# Patient Record
Sex: Female | Born: 1995 | Race: White | Hispanic: No | Marital: Single | Smoking: Never smoker
Health system: Southern US, Community
[De-identification: ages and names within clinical notes are randomized; demographics above are authoritative.]

## PROBLEM LIST (undated history)

## (undated) DIAGNOSIS — K219 Gastro-esophageal reflux disease without esophagitis: Secondary | ICD-10-CM

## (undated) DIAGNOSIS — F419 Anxiety disorder, unspecified: Secondary | ICD-10-CM

## (undated) HISTORY — PX: WISDOM TOOTH EXTRACTION: SHX21

---

## 2016-06-04 ENCOUNTER — Encounter (INDEPENDENT_AMBULATORY_CARE_PROVIDER_SITE_OTHER): Payer: Self-pay | Admitting: Internal Medicine

## 2016-06-07 ENCOUNTER — Encounter (HOSPITAL_COMMUNITY): Payer: Self-pay

## 2016-06-07 ENCOUNTER — Emergency Department (HOSPITAL_COMMUNITY): Payer: 59

## 2016-06-07 ENCOUNTER — Emergency Department (HOSPITAL_COMMUNITY)
Admission: EM | Admit: 2016-06-07 | Discharge: 2016-06-07 | Disposition: A | Discharge status: Home / Self Care | Payer: 59 | Attending: Emergency Medicine | Admitting: Emergency Medicine

## 2016-06-07 DIAGNOSIS — R1011 Right upper quadrant pain: Secondary | ICD-10-CM | POA: Diagnosis present

## 2016-06-07 DIAGNOSIS — Z79899 Other long term (current) drug therapy: Secondary | ICD-10-CM | POA: Diagnosis not present

## 2016-06-07 DIAGNOSIS — K8070 Calculus of gallbladder and bile duct without cholecystitis without obstruction: Secondary | ICD-10-CM | POA: Diagnosis not present

## 2016-06-07 DIAGNOSIS — K8050 Calculus of bile duct without cholangitis or cholecystitis without obstruction: Secondary | ICD-10-CM

## 2016-06-07 DIAGNOSIS — K8020 Calculus of gallbladder without cholecystitis without obstruction: Secondary | ICD-10-CM

## 2016-06-07 HISTORY — DX: Gastro-esophageal reflux disease without esophagitis: K21.9

## 2016-06-07 LAB — COMPREHENSIVE METABOLIC PANEL
ALT: 19 U/L (ref 14–54)
AST: 29 U/L (ref 15–41)
Albumin: 4.3 g/dL (ref 3.5–5.0)
Alkaline Phosphatase: 72 U/L (ref 38–126)
Anion gap: 10 (ref 5–15)
BUN: 8 mg/dL (ref 6–20)
CHLORIDE: 104 mmol/L (ref 101–111)
CO2: 24 mmol/L (ref 22–32)
CREATININE: 0.67 mg/dL (ref 0.44–1.00)
Calcium: 9.8 mg/dL (ref 8.9–10.3)
Glucose, Bld: 98 mg/dL (ref 65–99)
POTASSIUM: 3.7 mmol/L (ref 3.5–5.1)
Sodium: 138 mmol/L (ref 135–145)
TOTAL PROTEIN: 8.2 g/dL — AB (ref 6.5–8.1)
Total Bilirubin: 0.4 mg/dL (ref 0.3–1.2)

## 2016-06-07 LAB — URINALYSIS, ROUTINE W REFLEX MICROSCOPIC
Bilirubin Urine: NEGATIVE
GLUCOSE, UA: NEGATIVE mg/dL
KETONES UR: NEGATIVE mg/dL
Nitrite: NEGATIVE
PROTEIN: NEGATIVE mg/dL
Specific Gravity, Urine: 1.015 (ref 1.005–1.030)
pH: 8 (ref 5.0–8.0)

## 2016-06-07 LAB — CBC WITH DIFFERENTIAL/PLATELET
BASOS ABS: 0 10*3/uL (ref 0.0–0.1)
Basophils Relative: 1 %
EOS PCT: 1 %
Eosinophils Absolute: 0.1 10*3/uL (ref 0.0–0.7)
HEMATOCRIT: 41.4 % (ref 36.0–46.0)
Hemoglobin: 13.8 g/dL (ref 12.0–15.0)
LYMPHS ABS: 2.7 10*3/uL (ref 0.7–4.0)
LYMPHS PCT: 31 %
MCH: 28.3 pg (ref 26.0–34.0)
MCHC: 33.3 g/dL (ref 30.0–36.0)
MCV: 84.8 fL (ref 78.0–100.0)
MONO ABS: 0.4 10*3/uL (ref 0.1–1.0)
MONOS PCT: 5 %
NEUTROS ABS: 5.3 10*3/uL (ref 1.7–7.7)
Neutrophils Relative %: 62 %
PLATELETS: 371 10*3/uL (ref 150–400)
RBC: 4.88 MIL/uL (ref 3.87–5.11)
RDW: 12.9 % (ref 11.5–15.5)
WBC: 8.5 10*3/uL (ref 4.0–10.5)

## 2016-06-07 LAB — URINE MICROSCOPIC-ADD ON

## 2016-06-07 LAB — LIPASE, BLOOD: LIPASE: 36 U/L (ref 11–51)

## 2016-06-07 LAB — POC URINE PREG, ED: PREG TEST UR: NEGATIVE

## 2016-06-07 MED ORDER — KETOROLAC TROMETHAMINE 30 MG/ML IJ SOLN
30.0000 mg | Freq: Once | INTRAMUSCULAR | Status: AC
  Administered 2016-06-07: 30 mg via INTRAVENOUS
  Filled 2016-06-07: qty 1

## 2016-06-07 MED ORDER — HYDROMORPHONE HCL 1 MG/ML IJ SOLN
1.0000 mg | Freq: Once | INTRAMUSCULAR | Status: AC
  Administered 2016-06-07: 1 mg via INTRAVENOUS
  Filled 2016-06-07: qty 1

## 2016-06-07 MED ORDER — ONDANSETRON HCL 4 MG/2ML IJ SOLN
4.0000 mg | Freq: Once | INTRAMUSCULAR | Status: AC
  Administered 2016-06-07: 4 mg via INTRAVENOUS
  Filled 2016-06-07: qty 2

## 2016-06-07 MED ORDER — IOPAMIDOL (ISOVUE-300) INJECTION 61%
100.0000 mL | Freq: Once | INTRAVENOUS | Status: AC | PRN
  Administered 2016-06-07: 100 mL via INTRAVENOUS

## 2016-06-07 MED ORDER — DIATRIZOATE MEGLUMINE & SODIUM 66-10 % PO SOLN
ORAL | Status: AC
  Administered 2016-06-07: 04:00:00
  Filled 2016-06-07: qty 30

## 2016-06-07 MED ORDER — OXYCODONE-ACETAMINOPHEN 5-325 MG PO TABS: 1.0000 | ORAL_TABLET | ORAL | Status: AC | PRN

## 2016-06-07 MED ORDER — SODIUM CHLORIDE 0.9 % IV BOLUS (SEPSIS)
500.0000 mL | Freq: Once | INTRAVENOUS | Status: AC
  Administered 2016-06-07: 500 mL via INTRAVENOUS

## 2016-06-07 NOTE — ED Provider Notes (Signed)
Please see previous physicians note regarding patient's presenting history and physical, initial ED course, and associated medical decision making. 20 year old female who presents with severe upper abdominal pain. Had underwent CT of her abdomen and pelvis showing hazy gallbladder. Pending right upper quadrant ultrasound at time of sign out. Right upper quadrant ultrasound shows cholelithiasis without cholecystitis. On my reexamination she is well-appearing and pain-free. Abdomen benign. Vital signs stable. She is given follow-up with Dr. Lovell SheehanJenkins from general surgery. Strict return and follow-up instructions reviewed. She expressed understanding of all discharge instructions and felt comfortable with the plan of care.     Lavera Guiseana Duo Liu, MD 06/07/16 [phone]

## 2016-06-07 NOTE — ED Provider Notes (Signed)
CSN: [id number]     Arrival date & time 06/07/16  0131 History   First MD Initiated Contact with Patient 06/07/16 0239     Chief Complaint  Patient presents with  . Abdominal Pain     (Consider location/radiation/quality/duration/timing/severity/associated sxs/prior Treatment) HPI Comments: Patient presents to the emergency department for evaluation of upper abdominal pain. Patient has had at least 3 separate episodes of similar pain. Patient reports sharp and stabbing pain in the center of her upper abdomen that radiates into her back. Patient reports that she [facility] similar, was given a GI cocktail and told she had reflux. Mother reports that she is concerned about the possibility of gallbladder disease. Patient has never had an ultrasound or imaging of her gallbladder.  Patient is a 20 y.o. female presenting with abdominal pain.  Abdominal Pain   Past Medical History  Diagnosis Date  . Gastroesophageal reflux    History reviewed. No pertinent past surgical history. No family history on file. Social History  Substance Use Topics  . Smoking status: Never Smoker   . Smokeless tobacco: None  . Alcohol Use: No   OB History    No data available     Review of Systems  Gastrointestinal: Positive for abdominal pain.  All other systems reviewed and are negative.     Allergies  Review of patient's allergies indicates no known allergies.  Home Medications   Prior to Admission medications   Medication Sig Start Date End Date Taking? Authorizing Provider  drospirenone-ethinyl estradiol (YAZ,GIANVI,LORYNA) 3-0.02 MG tablet Take 1 tablet by mouth daily.   Yes Historical Provider, MD  pantoprazole (PROTONIX) 40 MG tablet Take 40 mg by mouth daily.   Yes Historical Provider, MD   BP 96/61 mmHg  Pulse 64  Temp(Src) 98.3 F (36.8 C) (Oral)  Resp 16  Ht 5\' 2"  (1.575 m)  Wt 150 lb (68.04 kg)  BMI 27.43 kg/m2  SpO2 100%  LMP  05/29/2016 Physical Exam  Constitutional: She is oriented to person, place, and time. She appears well-developed and well-nourished. No distress.  HENT:  Head: Normocephalic and atraumatic.  Right Ear: Hearing normal.  Left Ear: Hearing normal.  Nose: Nose normal.  Mouth/Throat: Oropharynx is clear and moist and mucous membranes are normal.  Eyes: Conjunctivae and EOM are normal. Pupils are equal, round, and reactive to light.  Neck: Normal range of motion. Neck supple.  Cardiovascular: Regular rhythm, S1 normal and S2 normal.  Exam reveals no gallop and no friction rub.   No murmur heard. Pulmonary/Chest: Effort normal and breath sounds normal. No respiratory distress. She exhibits no tenderness.  Abdominal: Soft. Normal appearance and bowel sounds are normal. There is no hepatosplenomegaly. There is tenderness in the right upper quadrant and epigastric area. There is no rebound, no guarding, no tenderness at McBurney's point and negative Murphy's sign. No hernia.  Musculoskeletal: Normal range of motion.  Neurological: She is alert and oriented to person, place, and time. She has normal strength. No cranial nerve deficit or sensory deficit. Coordination normal. GCS eye subscore is 4. GCS verbal subscore is 5. GCS motor subscore is 6.  Skin: Skin is warm, dry and intact. No rash noted. No cyanosis.  Psychiatric: She has a normal mood and affect. Her speech is normal and behavior is normal. Thought content normal.  Nursing note and vitals reviewed.   ED Course  Procedures (including critical care time) Labs Review Labs Reviewed  COMPREHENSIVE METABOLIC PANEL - Abnormal;  Notable for the following:    Total Protein 8.2 (*)    All other components within normal limits  URINALYSIS, ROUTINE W REFLEX MICROSCOPIC (NOT AT [facility]) - Abnormal; Notable for the following:    APPearance HAZY (*)    Hgb urine dipstick TRACE (*)    Leukocytes, UA TRACE (*)    All other components within normal limits   URINE MICROSCOPIC-ADD ON - Abnormal; Notable for the following:    Squamous Epithelial / LPF TOO NUMEROUS TO COUNT (*)    Bacteria, UA MANY (*)    All other components within normal limits  CBC WITH DIFFERENTIAL/PLATELET  LIPASE, BLOOD  POC URINE PREG, ED    Imaging Review Ct Abdomen Pelvis W Contrast  06/07/2016  CLINICAL DATA:  20 year old female with upper abdominal pain EXAM: CT ABDOMEN AND PELVIS WITH CONTRAST TECHNIQUE: Multidetector CT imaging of the abdomen and pelvis was performed using the standard protocol following bolus administration of intravenous contrast. CONTRAST:  100mL ISOVUE-300 IOPAMIDOL (ISOVUE-300) INJECTION 61% COMPARISON:  None. FINDINGS: The visualized lung bases are clear. No intra-abdominal free air or free fluid. There is minimal apparent haziness of the gallbladder. No pericholecystic fluid. No calcified stone. Ultrasound is recommended for better evaluation of the gallbladder. The liver, pancreas, spleen, adrenal glands, kidneys, visualized ureters, and urinary bladder appear unremarkable. The uterus is grossly unremarkable. The ovaries not well visualized. There is no evidence of bowel obstruction or active inflammation. Normal appendix. The abdominal aorta and IVC cc appear unremarkable. No portal venous gas identified. There is no adenopathy. There is a small fat containing umbilical hernia. The abdominal wall soft tissues are otherwise unremarkable. The osseous structures are intact. IMPRESSION: Mild hazy appearance of the gallbladder wall. No calcified gallstone. An early acute cholecystitis is not excluded. Ultrasound is recommended for further evaluation of the gallbladder. No other acute intra-abdominal or pelvic pathology identified. Electronically Signed   By: Elgie CollardArash  Radparvar M.D.   On: 06/07/2016 04:44   I have personally reviewed and evaluated these images and lab results as part of my medical decision-making.   EKG Interpretation None      MDM    Final diagnoses:  Right upper quadrant pain    Presents to the emergency part for evaluation of abdominal pain. Patient has had multiple episodes of upper abdominal pain which radiates into her back. She was seen in another ER and told she had reflux, but did not have gallbladder imaging. Patient did have right upper quadrant and epigastric tenderness on examination. CBC and CMP are normal. Patient started to have worsening pain despite Toradol and Dilaudid, therefore CT scan was ordered. Ultrasound not available at night. CT scan did show haziness of the gallbladder but no obvious stone noted. Patient did ultimately become pain-free without additional pain medicine. I did discuss with her the need for gallbladder ultrasound as symptoms seem consistent with biliary colic. She was given the option to be discharged and come back for outpatient study or wait until morning. Patient shows him to wait until morning. She is pain-free, will be appropriate for discharge and outpatient follow-up with surgery if gallstones are present. Otherwise she has outpatient follow-up with gastroenterology already arranged.    Gilda Creasehristopher J Pollina, MD 06/07/16 [phone]

## 2016-06-07 NOTE — Discharge Instructions (Signed)
Return without fail for worsening symptoms, including escalating and unremitting pain, fever, intractable vomiting unable to keep down any food or fluids, or hernias other symptoms concerning to you. Please follow-up with Dr. Lovell SheehanJenkins regarding potential elective surgery for your gallbladder.  Biliary Colic Biliary colic is a pain in the upper abdomen. The pain:  Is usually felt on the right side of the abdomen, but it may also be felt in the center of the abdomen, just below the breastbone (sternum).  May spread back toward the right shoulder blade.  May be steady or irregular.  May be accompanied by nausea and vomiting. Most of the time, the pain goes away in 1-5 hours. After the most intense pain passes, the abdomen may continue to ache mildly for about 24 hours. Biliary colic is caused by a blockage in the bile duct. The bile duct is a pathway that carries bile--a liquid that helps to digest fats--from the gallbladder to the small intestine. Biliary colic usually occurs after eating, when the digestive system demands bile. The pain develops when muscle cells contract forcefully to try to move the blockage so that bile can get by. HOME CARE INSTRUCTIONS  Take medicines only as directed by your health care provider.  Drink enough fluid to keep your urine clear or pale yellow.  Avoid fatty, greasy, and fried foods. These kinds of foods increase your body's demand for bile.  Avoid any foods that make your pain worse.  Avoid overeating.  Avoid having a large meal after fasting. SEEK MEDICAL CARE IF:  You develop a fever.  Your pain gets worse.  You vomit.  You develop nausea that prevents you from eating and drinking. SEEK IMMEDIATE MEDICAL CARE IF:  You suddenly develop a fever and shaking chills.  You develop a yellowish discoloration (jaundice) of:  Skin.  Whites of the eyes.  Mucous membranes.  You have continuous or severe pain that is not relieved with  medicines.  You have nausea and vomiting that is not relieved with medicines.  You develop dizziness or you faint.   This information is not intended to replace advice given to you by your health care provider. Make sure you discuss any questions you have with your health care provider.   Document Released: 05/06/2006 Document Revised: 04/19/2015 Document Reviewed: 09/14/2014 Elsevier Interactive Patient Education 2016 [employer]  Cholelithiasis Cholelithiasis (also called gallstones) is a form of gallbladder disease. The gallbladder is a small organ that helps you digest fats. Symptoms of gallstones are:  Feeling sick to your stomach (nausea).  Throwing up (vomiting).  Belly pain.  Yellowing of the skin (jaundice).  Sudden pain. You may feel the pain for minutes to hours.  Fever.  Pain to the touch. HOME CARE  Only take medicines as told by your doctor.  Eat a low-fat diet until you see your doctor again. Eating fat can result in pain.  Follow up with your doctor as told. Attacks usually happen time after time. Surgery is usually needed for permanent treatment. GET HELP RIGHT AWAY IF:   Your pain gets worse.  Your pain is not helped by medicines.  You have a fever and lasting symptoms for more than 2-3 days.  You have a fever and your symptoms suddenly get worse.  You keep feeling sick to your stomach and throwing up. MAKE SURE YOU:   Understand these instructions.  Will watch your condition.  Will get help right away if you are not doing well or get worse.  This information is not intended to replace advice given to you by your health care provider. Make sure you discuss any questions you have with your health care provider.   Document Released: 05/21/2008 Document Revised: 08/05/2013 Document Reviewed: 05/27/2013 Elsevier Interactive Patient Education [employer] Elsevier Inc.

## 2016-06-07 NOTE — ED Notes (Signed)
Pt c/o sharp stabbing upper abd pain since last evening

## 2016-06-13 ENCOUNTER — Encounter (HOSPITAL_COMMUNITY): Payer: Self-pay

## 2016-06-13 ENCOUNTER — Encounter (HOSPITAL_COMMUNITY)
Admission: RE | Admit: 2016-06-13 | Discharge: 2016-06-13 | Disposition: A | Discharge status: Home / Self Care | Payer: 59 | Source: Ambulatory Visit | Attending: Surgery | Admitting: Surgery

## 2016-06-13 HISTORY — DX: Anxiety disorder, unspecified: F41.9

## 2016-06-13 NOTE — Pre-Procedure Instructions (Signed)
PAT phone call. Went over all preoperative instructions verbally and patient verbalized understanding of them all

## 2016-06-15 ENCOUNTER — Encounter (HOSPITAL_COMMUNITY)
Admission: RE | Disposition: A | Discharge status: Home / Self Care | Payer: Self-pay | Source: Ambulatory Visit | Attending: Surgery

## 2016-06-15 ENCOUNTER — Ambulatory Visit (HOSPITAL_COMMUNITY): Payer: 59 | Admitting: Anesthesiology

## 2016-06-15 ENCOUNTER — Ambulatory Visit (HOSPITAL_COMMUNITY)
Admission: RE | Admit: 2016-06-15 | Discharge: 2016-06-15 | Disposition: A | Discharge status: Home / Self Care | Payer: 59 | Source: Ambulatory Visit | Attending: Surgery | Admitting: Surgery

## 2016-06-15 ENCOUNTER — Encounter (HOSPITAL_COMMUNITY): Payer: Self-pay | Admitting: *Deleted

## 2016-06-15 DIAGNOSIS — K219 Gastro-esophageal reflux disease without esophagitis: Secondary | ICD-10-CM | POA: Diagnosis not present

## 2016-06-15 DIAGNOSIS — K8010 Calculus of gallbladder with chronic cholecystitis without obstruction: Secondary | ICD-10-CM | POA: Insufficient documentation

## 2016-06-15 DIAGNOSIS — F419 Anxiety disorder, unspecified: Secondary | ICD-10-CM | POA: Diagnosis not present

## 2016-06-15 HISTORY — PX: CHOLECYSTECTOMY: SHX55

## 2016-06-15 SURGERY — LAPAROSCOPIC CHOLECYSTECTOMY
Anesthesia: General

## 2016-06-15 MED ORDER — BUPIVACAINE HCL (PF) 0.5 % IJ SOLN
INTRAMUSCULAR | Status: AC
  Filled 2016-06-15: qty 30

## 2016-06-15 MED ORDER — PROPOFOL 10 MG/ML IV BOLUS
INTRAVENOUS | Status: AC
  Filled 2016-06-15: qty 40

## 2016-06-15 MED ORDER — BUPIVACAINE HCL 0.5 % IJ SOLN
INTRAMUSCULAR | Status: DC | PRN
  Administered 2016-06-15: 18 mL via INTRAMUSCULAR
  Administered 2016-06-15: 2 mL via INTRAMUSCULAR

## 2016-06-15 MED ORDER — FENTANYL CITRATE (PF) 100 MCG/2ML IJ SOLN
INTRAMUSCULAR | Status: DC | PRN
  Administered 2016-06-15: 50 ug via INTRAVENOUS
  Administered 2016-06-15 (×2): 25 ug via INTRAVENOUS
  Administered 2016-06-15 (×2): 50 ug via INTRAVENOUS

## 2016-06-15 MED ORDER — PROPOFOL 10 MG/ML IV BOLUS
INTRAVENOUS | Status: DC | PRN
  Administered 2016-06-15: 150 mg via INTRAVENOUS
  Administered 2016-06-15: 30 mg via INTRAVENOUS

## 2016-06-15 MED ORDER — DEXAMETHASONE SODIUM PHOSPHATE 4 MG/ML IJ SOLN
4.0000 mg | Freq: Once | INTRAMUSCULAR | Status: AC
  Administered 2016-06-15: 4 mg via INTRAVENOUS

## 2016-06-15 MED ORDER — FENTANYL CITRATE (PF) 250 MCG/5ML IJ SOLN
INTRAMUSCULAR | Status: AC
  Filled 2016-06-15: qty 5

## 2016-06-15 MED ORDER — FENTANYL CITRATE (PF) 100 MCG/2ML IJ SOLN
INTRAMUSCULAR | Status: AC
  Filled 2016-06-15: qty 2

## 2016-06-15 MED ORDER — GLYCOPYRROLATE 0.2 MG/ML IJ SOLN
INTRAMUSCULAR | Status: AC
  Filled 2016-06-15: qty 3

## 2016-06-15 MED ORDER — OXYCODONE-ACETAMINOPHEN 5-325 MG PO TABS: 1.0000 | ORAL_TABLET | ORAL | Status: AC | PRN

## 2016-06-15 MED ORDER — ROCURONIUM BROMIDE 100 MG/10ML IV SOLN
INTRAVENOUS | Status: DC | PRN
  Administered 2016-06-15: 25 mg via INTRAVENOUS
  Administered 2016-06-15 (×2): 5 mg via INTRAVENOUS

## 2016-06-15 MED ORDER — SODIUM CHLORIDE 0.9 % IR SOLN
Status: DC | PRN
  Administered 2016-06-15: 1000 mL

## 2016-06-15 MED ORDER — HEMOSTATIC AGENTS (NO CHARGE) OPTIME
TOPICAL | Status: DC | PRN
  Administered 2016-06-15: 1 via TOPICAL

## 2016-06-15 MED ORDER — LIDOCAINE HCL (PF) 1 % IJ SOLN
INTRAMUSCULAR | Status: AC
  Filled 2016-06-15: qty 5

## 2016-06-15 MED ORDER — DEXAMETHASONE SODIUM PHOSPHATE 4 MG/ML IJ SOLN
INTRAMUSCULAR | Status: AC
  Filled 2016-06-15: qty 1

## 2016-06-15 MED ORDER — SODIUM CHLORIDE 0.9% FLUSH
INTRAVENOUS | Status: AC
  Filled 2016-06-15: qty 10

## 2016-06-15 MED ORDER — PROMETHAZINE HCL 25 MG/ML IJ SOLN
6.2500 mg | INTRAMUSCULAR | Status: AC | PRN
  Administered 2016-06-15 (×2): 6.25 mg via INTRAVENOUS
  Filled 2016-06-15: qty 1

## 2016-06-15 MED ORDER — CEFAZOLIN SODIUM-DEXTROSE 2-4 GM/100ML-% IV SOLN
2.0000 g | INTRAVENOUS | Status: AC
  Administered 2016-06-15: 2 g via INTRAVENOUS
  Filled 2016-06-15: qty 100

## 2016-06-15 MED ORDER — NEOSTIGMINE METHYLSULFATE 10 MG/10ML IV SOLN
INTRAVENOUS | Status: DC | PRN
  Administered 2016-06-15: 3 mg via INTRAVENOUS

## 2016-06-15 MED ORDER — ONDANSETRON HCL 4 MG/2ML IJ SOLN
4.0000 mg | Freq: Once | INTRAMUSCULAR | Status: AC
  Administered 2016-06-15: 4 mg via INTRAVENOUS

## 2016-06-15 MED ORDER — LIDOCAINE HCL (CARDIAC) 10 MG/ML IV SOLN
INTRAVENOUS | Status: DC | PRN
  Administered 2016-06-15: 50 mg via INTRAVENOUS

## 2016-06-15 MED ORDER — MIDAZOLAM HCL 2 MG/2ML IJ SOLN
INTRAMUSCULAR | Status: AC
  Filled 2016-06-15: qty 2

## 2016-06-15 MED ORDER — ONDANSETRON HCL 4 MG/2ML IJ SOLN
INTRAMUSCULAR | Status: AC
  Filled 2016-06-15: qty 2

## 2016-06-15 MED ORDER — MIDAZOLAM HCL 2 MG/2ML IJ SOLN
1.0000 mg | INTRAMUSCULAR | Status: DC | PRN
  Administered 2016-06-15: 2 mg via INTRAVENOUS

## 2016-06-15 MED ORDER — LACTATED RINGERS IV SOLN
INTRAVENOUS | Status: DC
  Administered 2016-06-15: 1000 mL via INTRAVENOUS
  Administered 2016-06-15: 08:00:00 via INTRAVENOUS

## 2016-06-15 MED ORDER — GLYCOPYRROLATE 0.2 MG/ML IJ SOLN
INTRAMUSCULAR | Status: DC | PRN
  Administered 2016-06-15: 0.6 mg via INTRAVENOUS

## 2016-06-15 MED ORDER — ROCURONIUM BROMIDE 50 MG/5ML IV SOLN
INTRAVENOUS | Status: AC
  Filled 2016-06-15: qty 1

## 2016-06-15 MED ORDER — CHLORHEXIDINE GLUCONATE CLOTH 2 % EX PADS: 6.0000 | MEDICATED_PAD | Freq: Once | CUTANEOUS | Status: DC

## 2016-06-15 MED ORDER — LIDOCAINE HCL (PF) 1 % IJ SOLN
INTRAMUSCULAR | Status: AC
  Filled 2016-06-15: qty 30

## 2016-06-15 MED ORDER — NEOSTIGMINE METHYLSULFATE 10 MG/10ML IV SOLN
INTRAVENOUS | Status: AC
  Filled 2016-06-15: qty 1

## 2016-06-15 MED ORDER — PROMETHAZINE HCL 25 MG/ML IJ SOLN
INTRAMUSCULAR | Status: AC
  Filled 2016-06-15: qty 1

## 2016-06-15 MED ORDER — FENTANYL CITRATE (PF) 100 MCG/2ML IJ SOLN
25.0000 ug | INTRAMUSCULAR | Status: DC | PRN
  Administered 2016-06-15 (×2): 50 ug via INTRAVENOUS
  Filled 2016-06-15: qty 2

## 2016-06-15 MED ORDER — FENTANYL CITRATE (PF) 100 MCG/2ML IJ SOLN
25.0000 ug | INTRAMUSCULAR | Status: AC
  Administered 2016-06-15: 25 ug via INTRAVENOUS

## 2016-06-15 MED ORDER — ONDANSETRON HCL 4 MG/2ML IJ SOLN
4.0000 mg | Freq: Once | INTRAMUSCULAR | Status: AC | PRN
  Administered 2016-06-15: 4 mg via INTRAVENOUS

## 2016-06-15 SURGICAL SUPPLY — 40 items
APPLIER CLIP LAPSCP 10X32 DD (CLIP) ×3 IMPLANT
BAG HAMPER (MISCELLANEOUS) ×3 IMPLANT
CHLORAPREP W/TINT 26ML (MISCELLANEOUS) ×3 IMPLANT
CLOTH BEACON ORANGE TIMEOUT ST (SAFETY) ×3 IMPLANT
COVER LIGHT HANDLE STERIS (MISCELLANEOUS) ×6 IMPLANT
DECANTER SPIKE VIAL GLASS SM (MISCELLANEOUS) ×6 IMPLANT
DERMABOND ADVANCED (GAUZE/BANDAGES/DRESSINGS) ×2
DERMABOND ADVANCED .7 DNX12 (GAUZE/BANDAGES/DRESSINGS) ×1 IMPLANT
DEVICE TROCAR PUNCTURE CLOSURE (ENDOMECHANICALS) ×3 IMPLANT
ELECT REM PT RETURN 9FT ADLT (ELECTROSURGICAL) ×3
ELECTRODE REM PT RTRN 9FT ADLT (ELECTROSURGICAL) ×1 IMPLANT
FILTER SMOKE EVAC LAPAROSHD (FILTER) ×3 IMPLANT
FORMALIN 10 PREFIL 120ML (MISCELLANEOUS) ×3 IMPLANT
GLOVE BIOGEL PI IND STRL 7.0 (GLOVE) ×3 IMPLANT
GLOVE BIOGEL PI IND STRL 7.5 (GLOVE) ×2 IMPLANT
GLOVE BIOGEL PI INDICATOR 7.0 (GLOVE) ×6
GLOVE BIOGEL PI INDICATOR 7.5 (GLOVE) ×4
GLOVE ECLIPSE 7.0 STRL STRAW (GLOVE) ×9 IMPLANT
GOWN STRL REUS W/ TWL XL LVL3 (GOWN DISPOSABLE) ×1 IMPLANT
GOWN STRL REUS W/TWL LRG LVL3 (GOWN DISPOSABLE) ×6 IMPLANT
GOWN STRL REUS W/TWL XL LVL3 (GOWN DISPOSABLE) ×2
HEMOSTAT SNOW SURGICEL 2X4 (HEMOSTASIS) ×3 IMPLANT
INST SET LAPROSCOPIC AP (KITS) ×3 IMPLANT
KIT ROOM TURNOVER APOR (KITS) ×3 IMPLANT
MANIFOLD NEPTUNE II (INSTRUMENTS) ×3 IMPLANT
NEEDLE INSUFFLATION 14GA 120MM (NEEDLE) ×3 IMPLANT
NS IRRIG 1000ML POUR BTL (IV SOLUTION) ×3 IMPLANT
PACK LAP CHOLE LZT030E (CUSTOM PROCEDURE TRAY) ×3 IMPLANT
PAD ARMBOARD 7.5X6 YLW CONV (MISCELLANEOUS) ×3 IMPLANT
POUCH SPECIMEN RETRIEVAL 10MM (ENDOMECHANICALS) ×3 IMPLANT
SET BASIN LINEN APH (SET/KITS/TRAYS/PACK) ×3 IMPLANT
SLEEVE ENDOPATH XCEL 5M (ENDOMECHANICALS) ×6 IMPLANT
SUT VIC AB 4-0 PS2 27 (SUTURE) ×3 IMPLANT
SUT VICRYL 0 UR6 27IN ABS (SUTURE) ×3 IMPLANT
SUT VICRYL AB 3-0 FS1 BRD 27IN (SUTURE) ×3 IMPLANT
TROCAR ENDO BLADELESS 11MM (ENDOMECHANICALS) ×3 IMPLANT
TROCAR XCEL NON-BLD 5MMX100MML (ENDOMECHANICALS) ×3 IMPLANT
TUBING INSUFFLATION (TUBING) ×3 IMPLANT
WARMER LAPAROSCOPE (MISCELLANEOUS) ×3 IMPLANT
YANKAUER SUCT 12FT TUBE ARGYLE (SUCTIONS) ×3 IMPLANT

## 2016-06-15 NOTE — Transfer of Care (Signed)
Immediate Anesthesia Transfer of Care Note  Patient: [name]  Procedure(s) Performed: Procedure(s): LAPAROSCOPIC CHOLECYSTECTOMY (N/A)  Patient Location: PACU  Anesthesia Type:General  Level of Consciousness: sedated and patient cooperative  Airway & Oxygen Therapy: Patient Spontanous Breathing and Patient connected to nasal cannula oxygen  Post-op Assessment: Report given to RN and Post -op Vital signs reviewed and stable  Post vital signs: Reviewed and stable    Last Pain: There were no vitals filed for this visit.       Complications: No apparent anesthesia complications

## 2016-06-15 NOTE — H&P (Signed)
SURGICAL HISTORY & PHYSICAL   HISTORY OF PRESENT ILLNESS (HPI):  20 y.o. female presented with 3 epidsodes of post-prandial RUQ and epigastric pain associated with fatty and greasy fried foods, particularly fried mozzarella sticks, for which she presented [facility] and was told she has reflux without any additional testing. Patient then presented to [facility] ED, where an abdominal ultrasound was performed and revealed gallstones. Patient denies any symptoms of reflux during the same recent period and denies any change in bowel function, nausea/vomiting, fever/chills, CP, or SOB.   PAST MEDICAL HISTORY (PM[facility]):  Past Medical History  Diagnosis Date  . Gastroesophageal reflux   . Anxiety     Reviewed. Otherwise negative.   PAST SURGICAL HISTORY [facility]):  Past Surgical History  Procedure Laterality Date  . Wisdom tooth extraction      Reviewed. Otherwise negative.   MEDICATIONS:  Prior to Admission medications   Medication Sig Start Date End Date Taking? Authorizing Provider  acetaminophen (TYLENOL) 500 MG tablet Take 1,000 mg by mouth every 6 (six) hours as needed for moderate pain.   Yes Historical Provider, MD  acidophilus (RISAQUAD) CAPS capsule Take by mouth daily.   Yes Historical Provider, MD  Cyanocobalamin (VITAMIN B-12 PO) Take 1 tablet by mouth daily.   Yes Historical Provider, MD  drospirenone-ethinyl estradiol (YAZ,GIANVI,LORYNA) 3-0.02 MG tablet Take 1 tablet by mouth daily.   Yes Historical Provider, MD  ibuprofen (ADVIL,MOTRIN) 200 MG tablet Take 400 mg by mouth every 6 (six) hours as needed for moderate pain.   Yes Historical Provider, MD  Multiple Vitamin (MULTIVITAMIN WITH MINERALS) TABS tablet Take 1 tablet by mouth daily.   Yes Historical Provider, MD  oxyCODONE-acetaminophen (PERCOCET/ROXICET) 5-325 MG tablet Take 1-2 tablets by mouth every 4 (four) hours as needed for severe pain. 06/07/16  Yes Lavera Guiseana Duo Liu, MD     ALLERGIES:  No Known Allergies    SOCIAL HISTORY:  Social History   Social History  . Marital Status: Single    Spouse Name: N/A  . Number of Children: N/A  . Years of Education: N/A   Occupational History  . Not on file.   Social History Main Topics  . Smoking status: Never Smoker   . Smokeless tobacco: Not on file  . Alcohol Use: No  . Drug Use: No  . Sexual Activity: Yes    Birth Control/ Protection: Pill   Other Topics Concern  . Not on file   Social History Narrative    The patient currently resides (home / rehab facility / nursing home): Home  The patient normally is (ambulatory / bedbound) : Ambulatory   FAMILY HISTORY:  History reviewed. No pertinent family history.  Otherwise negative.   REVIEW OF SYSTEMS:  Constitutional: denies any other weight loss, fever, chills, or sweats  Eyes: denies any other vision changes, history of eye injury  ENT: denies sore throat, hearing problems  Respiratory: denies shortness of breath, wheezing  Cardiovascular: denies chest pain, palpitations  Gastrointestinal: denies N/V, diarrhea  Musculoskeletal: denies any other joint pains or cramps  Skin: Denies any other rashes or skin discolorations  Neurological: denies any other headache, dizziness, weakness  Psychiatric: denies any other depression, anxiety   All other review of systems were otherwise negative.  VITAL SIGNS:  Temp:  [97.9 F (36.6 C)] 97.9 F (36.6 C) (06/30 16100633) Pulse Rate:  [89] 89 (06/30 0633) Resp:  [11-18] 15 (06/30 0715) BP: (114-118)/(62-73) 117/73 mmHg (06/30 0715) SpO2:  [99 %-100 %]  100 % (06/30 0715)             INTAKE/OUTPUT:  This shift:    Last 2 shifts: @IOLAST2SHIFTS @  PHYSICAL EXAM:  Constitutional:  -- Normal body habitus  -- Awake, alert, and oriented x3  Eyes:  -- Pupils equally round and reactive to light  -- No scleral icterus  Ear, nose, throat:  -- No jugular venous distension  Pulmonary:  -- No crackles  -- Equal breath sounds bilaterally   Cardiovascular:  -- S1, S2 present  -- No pericardial rubs  Gastrointestinal:  -- Soft, nontender, nondistended, no guarding/rebound  -- No abdominal masses appreciated, pulsatile or otherwise  Musculoskeletal / Integumentary:  -- Wounds or skin discoloration: None -- Extremities: B/L UE and LE FROM, hands and feet warm, no edema  Neurologic:  -- Motor function: Intact and symmetric -- Sensation: Intact and symmetric  Labs:  CBC:  Lab Results  Component Value Date   WBC 8.5 06/07/2016   RBC 4.88 06/07/2016   BMP:  Lab Results  Component Value Date   GLUCOSE 98 06/07/2016   CO2 24 06/07/2016   BUN 8 06/07/2016   CREATININE 0.67 06/07/2016   CALCIUM 9.8 06/07/2016     Imaging studies:  RUQ Ultrasound Gallstones without cholecystitis   Assessment/Plan:  20 y.o. female with symptomatic cholelithiasis.    - NPO   - all risks, benefits, and alternatives to cholecystectomy discussed with patient, and informed consent obtained   - elective laparoscopic cholecystectomy today   - DVT prophylaxis  All of the above findings and recommendations were discussed with the patient and her family, and all of her and family's questions were answered to their expressed satisfaction.  -- Scherrie GerlachJason E. Earlene Plateravis, MD [place]: [facility] Office: [phone]

## 2016-06-15 NOTE — Anesthesia Preprocedure Evaluation (Signed)
Anesthesia Evaluation  Patient identified by MRN, date of birth, ID band Patient awake    Reviewed: Allergy & Precautions, NPO status , Patient's Chart, lab work & pertinent test results  Airway Mallampati: II  TM Distance: >3 FB     Dental  (+) Teeth Intact   Pulmonary    breath sounds clear to auscultation       Cardiovascular negative cardio ROS   Rhythm:Regular Rate:Normal     Neuro/Psych PSYCHIATRIC DISORDERS Anxiety    GI/Hepatic neg GERD  ,  Endo/Other    Renal/GU      Musculoskeletal   Abdominal   Peds  Hematology   Anesthesia Other Findings   Reproductive/Obstetrics                             Anesthesia Physical Anesthesia Plan  ASA: II  Anesthesia Plan: General   Post-op Pain Management:    Induction: Intravenous  Airway Management Planned: Oral ETT  Additional Equipment:   Intra-op Plan:   Post-operative Plan: Extubation in OR  Informed Consent: I have reviewed the patients History and Physical, chart, labs and discussed the procedure including the risks, benefits and alternatives for the proposed anesthesia with the patient or authorized representative who has indicated his/her understanding and acceptance.     Plan Discussed with:   Anesthesia Plan Comments:         Anesthesia Quick Evaluation

## 2016-06-15 NOTE — Anesthesia Postprocedure Evaluation (Signed)
Anesthesia Post Note  Patient: [name]  Procedure(s) Performed: Procedure(s) (LRB): LAPAROSCOPIC CHOLECYSTECTOMY (N/A)  Patient location during evaluation: PACU Anesthesia Type: General Level of consciousness: awake and alert, oriented and patient cooperative Pain management: pain level controlled Vital Signs Assessment: post-procedure vital signs reviewed and stable Respiratory status: spontaneous breathing, nonlabored ventilation and respiratory function stable Cardiovascular status: blood pressure returned to baseline Postop Assessment: no signs of nausea or vomiting Anesthetic complications: no    Last Vitals:  Filed Vitals:   06/15/16 0930 06/15/16 0935  BP: 110/85   Pulse: 56 63  Temp:    Resp: 11 12    Last Pain:  Filed Vitals:   06/15/16 0937  PainSc: 4                  Marlin Brys J

## 2016-06-15 NOTE — Op Note (Signed)
SURGICAL OPERATIVE REPORT   DATE OF PROCEDURE: 06/15/2016   ATTENDING Surgeon(s): Ancil LinseyJason Evan Davis, MD  ANESTHESIA: GETA  PRE-OPERATIVE DIAGNOSIS: Symptomatic Cholelithiasis  POST-OPERATIVE DIAGNOSIS: Symptomatic Cholelithiasis  PROCEDURE(S):  1.) Laparoscopic Cholecystectomy  INTRAOPERATIVE FINDINGS: Gallbladder inflammation consistent with pre-operative diagnosis   INTRAOPERATIVE FLUIDS: 1400 mL crystalloid   ESTIMATED BLOOD LOSS: Minimal (<30 mL)   URINE OUTPUT: No foley  SPECIMENS: Gallbladder  IMPLANTS: None  DRAINS: None   COMPLICATIONS: None apparent   CONDITION AT COMPLETION: Hemodynamically stable and extubated  DISPOSITION: PACU   INDICATION(S) FOR PROCEDURE:  20 y.o. female presented with 3 epidsodes of post-prandial RUQ and epigastric pain associated with fatty and greasy fried foods, particularly fried mozzarella sticks, for which she presented [facility] and was told she has reflux without any additional testing. Patient then presented to [facility] ED, where an abdominal ultrasound was performed and revealed gallstones. Patient denies any symptoms of reflux during the same period and was referred to surgery for evaluation and cholecystectomy. All risks, benefits, and alternatives to above elective procedures were discussed with the patient, who elected to proceed, and informed consent was accordingly obtained at that time.   DETAILS OF PROCEDURE:  Patient was brought to the operating suite and appropriately identified. General anesthesia was administered along with peri-operative prophylactic IV antibiotics, and endotracheal intubation was performed by anesthesiologist, along with NG/OG tube for gastric decompression. In supine position, operative site was prepped and draped in usual sterile fashion, and following a brief time out, initial 5 mm incision was made in a natural skin crease just above the umbilicus. Fascia was then elevated, and a  Verress needle was inserted and its proper position confirmed using aspiration and saline meniscus test.  Upon insufflation of the abdominal cavity with carbon dioxide to a well-tolerated pressure of 12-15 mmHg, 5 mm peri-umbilical port followed by laparoscope were inserted and used to inspect the abdominal cavity and its contents with no injuries from insertion of the first trochar noted. Three additional trocars were inserted, one at the epigastric position (10 mm) and two along the Right costal margin (5 mm). The table was then placed in reverse Trendelenburg position with the Right side up. Filmy adhesions between the gallbladder and omentum/duodenum/transverse colon were lysed using combined blunt and sharp dissection. The apex/dome of the gallbladder was grasped with an atraumatic grasper passed through the lateral port and retracted apically over the liver. The infundibulum was also grasped and retracted, exposing Calot's triangle. The peritoneum overlying the gallbladder infundibulum was incised and dissected free of surrounding peritoneal attachments, revealing the cystic duct and cystic artery, which were clipped twice on the patient side and once on the gallbladder specimen side close to the gallbladder. The gallbladder was then dissected from its peritoneal attachments to the liver using electrocautery, and the gallbladder was placed into a laparoscopic specimen bag and removed from the abdominal cavity via the epigastric port site. Hemostasis and secure placement of clips were confirmed, and intra-peritoneal cavity was inspected with no additional findings. Endoclose laparoscopic fascial closure device was then used to re-approximate fascia at the 10 mm epigastric port site.  All ports were then removed under direct visualization, and abdominal cavity was desuflated. All port sites were irrigated/cleaned, additional local anesthetic was injected at each incision, 3-0 Vicryl was used to  re-approximate dermis at 10 mm port site(s), and subcuticular 4-0 Vicryl suture was used to re-approximate skin. Skin was then cleaned, dried, and sterile skin glue was applied. Patient was  then safely able to be awakened, extubated, and transferred to PACU for post-operative monitoring and care.   I was present for all aspects of procedure, and there were no intra-operative complications apparent.

## 2016-06-15 NOTE — Anesthesia Procedure Notes (Signed)
Procedure Name: Intubation Date/Time: 06/15/2016 7:46 AM Performed by: Franco NonesYATES, Tanielle Emigh S Pre-anesthesia Checklist: Patient identified, Patient being monitored, Timeout performed, Emergency Drugs available and Suction available Patient Re-evaluated:Patient Re-evaluated prior to inductionOxygen Delivery Method: Circle system utilized Preoxygenation: Pre-oxygenation with 100% oxygen Intubation Type: IV induction Ventilation: Mask ventilation without difficulty Laryngoscope Size: Mac and 3 Grade View: Grade I Tube type: Oral Tube size: 7.0 mm Number of attempts: 1 Airway Equipment and Method: Stylet Placement Confirmation: ETT inserted through vocal cords under direct vision,  positive ETCO2 and breath sounds checked- equal and bilateral Secured at: 21 cm Tube secured with: Tape Dental Injury: Teeth and Oropharynx as per pre-operative assessment

## 2016-06-15 NOTE — Discharge Instructions (Addendum)
In addition to included general post-operative instructions for laparoscopic cholecystectomy, ° °Diet: Resume home heart healthy diet.  ° °Activity: No heavy lifting (children, pets, laundry) or strenuous activity until follow-up, but light activity and walking are encouraged. Do not drive or drink alcohol if taking narcotic pain medications.  ° °Wound care: 2 days after surgery (Sunday, 7/2), may shower/get incision wet with soapy water and pat dry (do not rub incisions), but no baths or submerging incision underwater until follow-up.  ° °Medications: Resume all home medications. For mild to moderate pain: acetaminophen (Tylenol) or ibuprofen (if no kidney disease). Narcotic pain medications, if prescribed, can be used for severe pain, though may cause nausea, constipation, and drowsiness. Do not combine Tylenol and Percocet within a 6 hour period as Percocet contains Tylenol. ° °Call office ([phone]) at any time if any questions, worsening pain, fevers/chills, bleeding, drainage from incision site, or other concerns. ° °Laparoscopic Cholecystectomy, Care After °These instructions give you information about caring for yourself after your procedure. Your doctor may also give you more specific instructions. Call your doctor if you have any problems or questions after your procedure. °HOME CARE °Incision Care °· Follow instructions from your doctor about how to take care of your cuts from surgery (incisions). Make sure you: °¨ Wash your hands with soap and water before you change your bandage (dressing). If you cannot use soap and water, use hand sanitizer. °¨ Change your bandage as told by your doctor. °¨ Leave stitches (sutures), skin glue, or skin tape (adhesive) strips in place. They may need to stay in place for 2 weeks or longer. If tape strips get loose and curl up, you may trim the loose edges. Do not remove tape strips completely unless your doctor says it is okay. °· Do not take baths, swim, or use a  hot tub until your doctor says it is okay. Ask your doctor if you can take showers. You may only be allowed to take sponge baths. °General Instructions °· Take over-the-counter and prescription medicines only as told by your doctor. °· Do not drive or use heavy machinery while taking prescription pain medicine. °· Return to your normal diet as told by your doctor. °· Do not lift anything that is heavier than 10 lb (4.5 kg). °· Do not play contact sports for 1 week or until your doctor says it is okay. °GET HELP IF: °· You have redness, swelling, or pain at the site of your surgical cuts. °· You have fluid, blood, or pus coming from your cuts. °· You notice a bad smell coming from your cut area. °· Your surgical cuts break open. °· You have a fever. °GET HELP RIGHT AWAY IF:  °· You have a rash. °· You have trouble breathing. °· You have chest pain. °· You have pain in your shoulders (shoulder strap areas) that is getting worse. °· You pass out (faint) or feel dizzy while you are standing. °· You have very bad pain in your belly (abdomen). °· You feel sick to your stomach (nauseous) or throw up (vomit) for more than 1 day. °  °This information is not intended to replace advice given to you by your health care provider. Make sure you discuss any questions you have with your health care provider. °  °Document Released: 09/11/2008 Document Revised: 08/24/2015 Document Reviewed: 07/15/2013 °[employer]vier Interactive Patient Education ©2016 [employer] ° °

## 2016-06-18 ENCOUNTER — Encounter (HOSPITAL_COMMUNITY): Payer: Self-pay | Admitting: Surgery

## 2016-06-18 ENCOUNTER — Ambulatory Visit (INDEPENDENT_AMBULATORY_CARE_PROVIDER_SITE_OTHER): Payer: Self-pay | Admitting: Internal Medicine

## 2017-01-11 IMAGING — US US ABDOMEN LIMITED
1 series · 14 of 25 positions shown · non-contrast
Comparison: CT scan of same day.

CLINICAL DATA: Acute right upper quadrant abdominal pain.

EXAM:
US ABDOMEN LIMITED - RIGHT UPPER QUADRANT

[Series 1: us abdomen limited · 0.16mm/px · 14 of 54 slices shown]
[im 1/54]
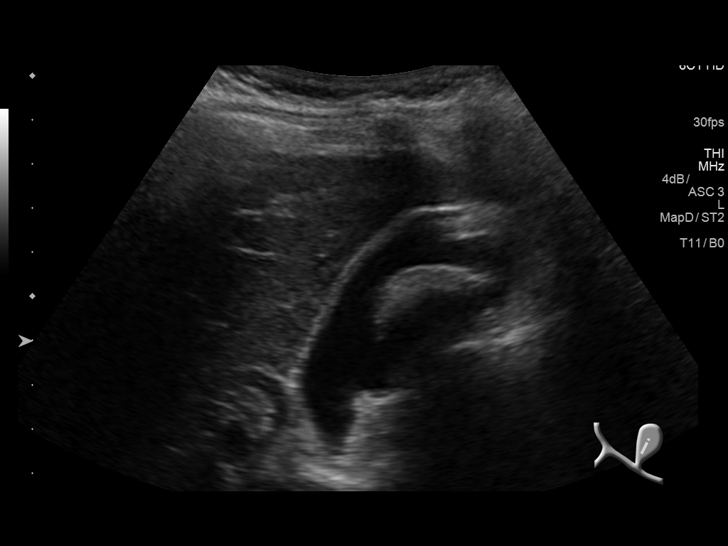
[im 5/54]
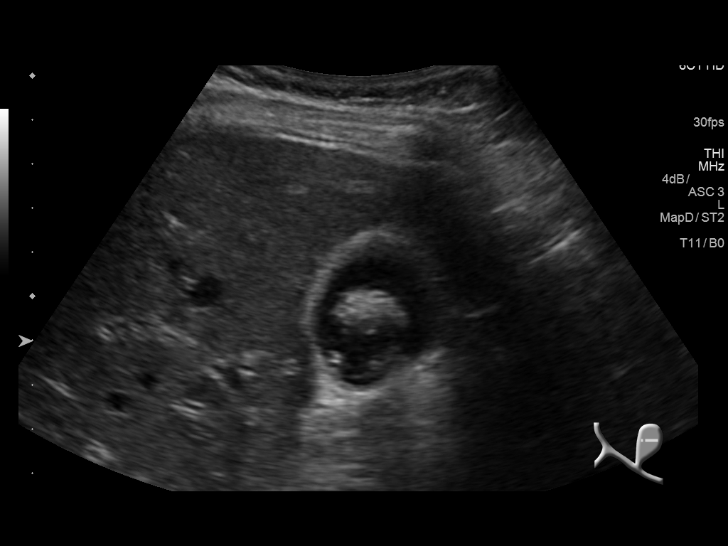
[im 9/54]
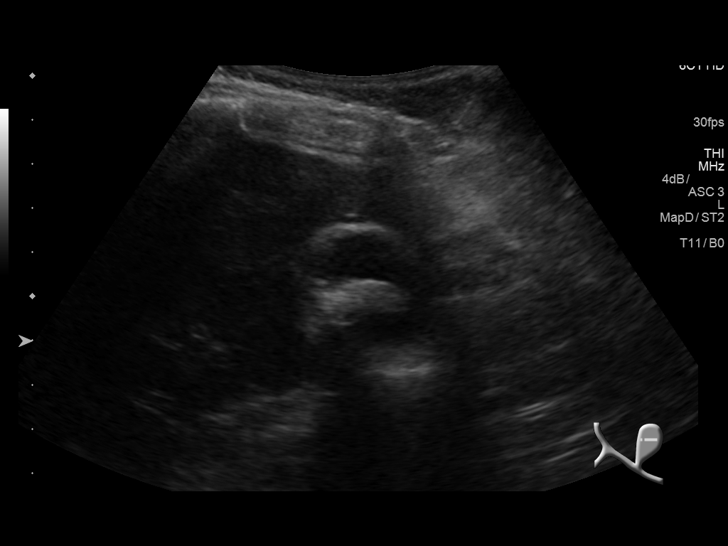
[im 14/54]
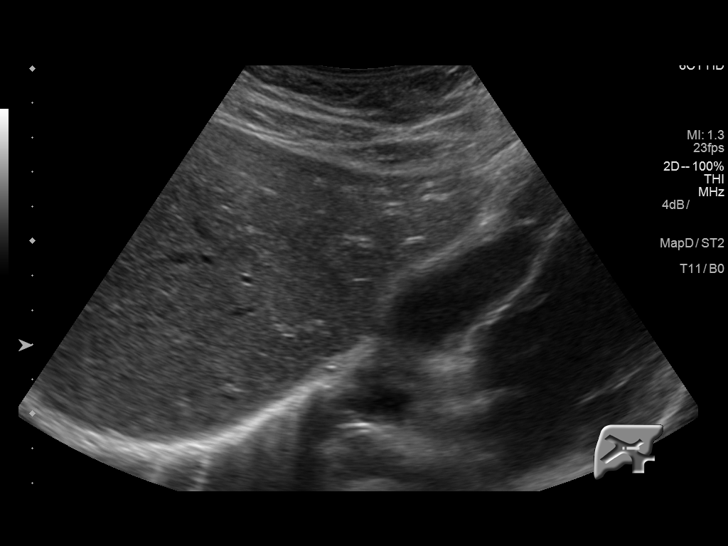
[im 18/54]
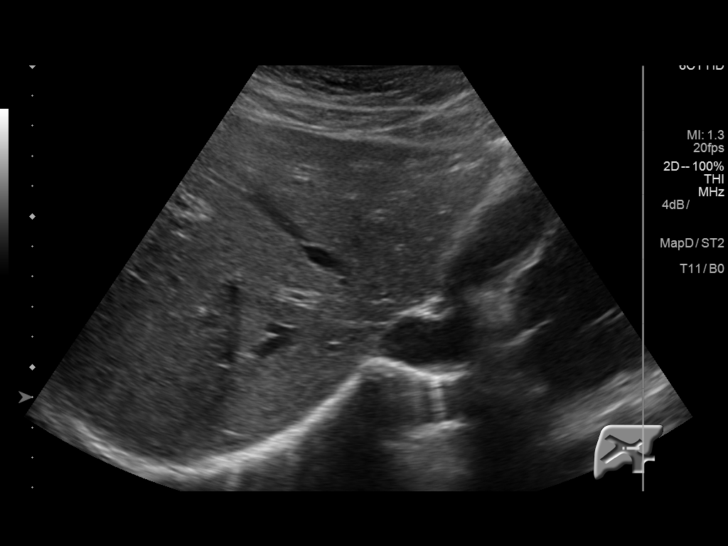
[im 20/54]
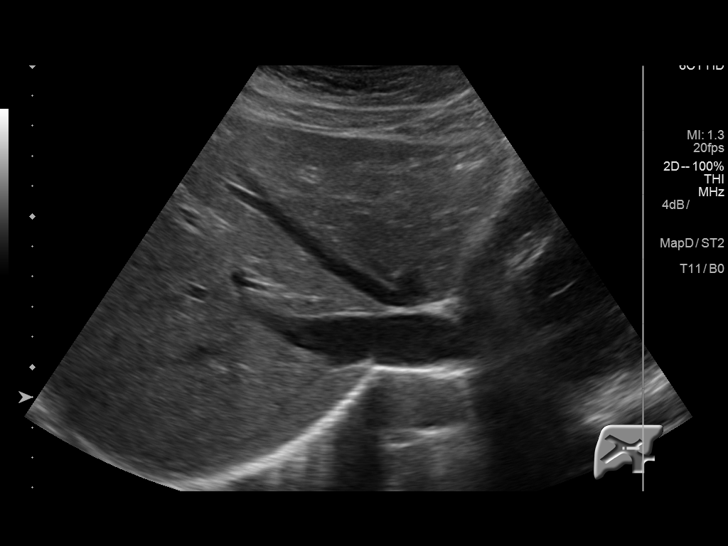
[im 25/54]
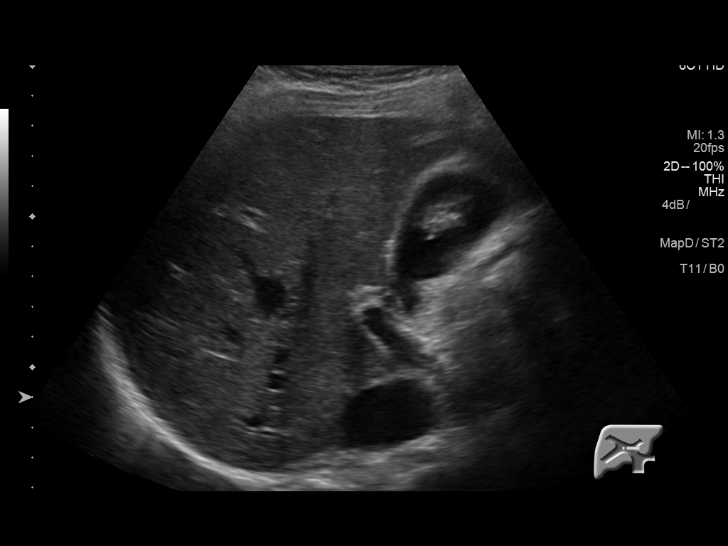
[im 29/54]
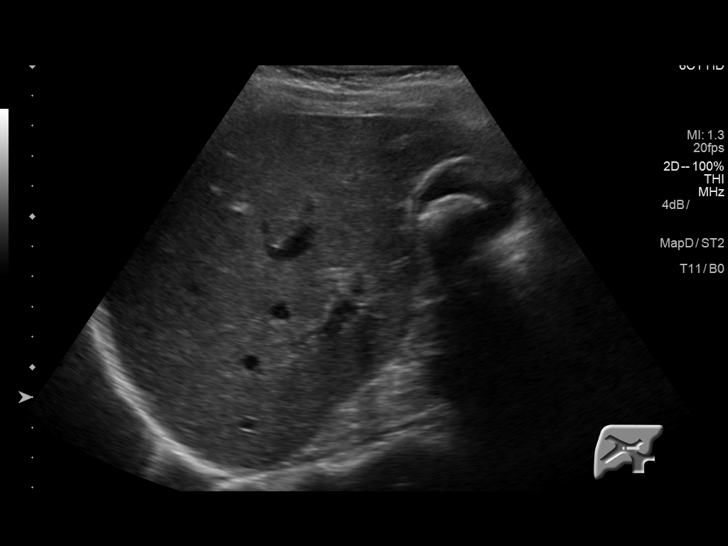
[im 34/54]
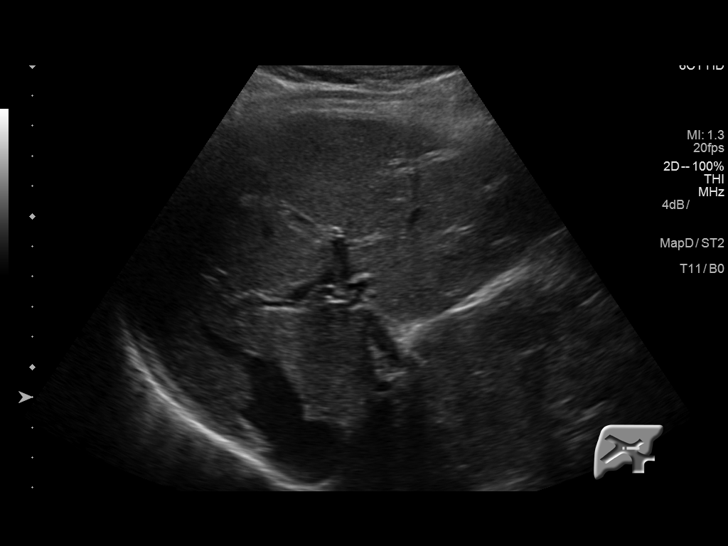
[im 36/54]
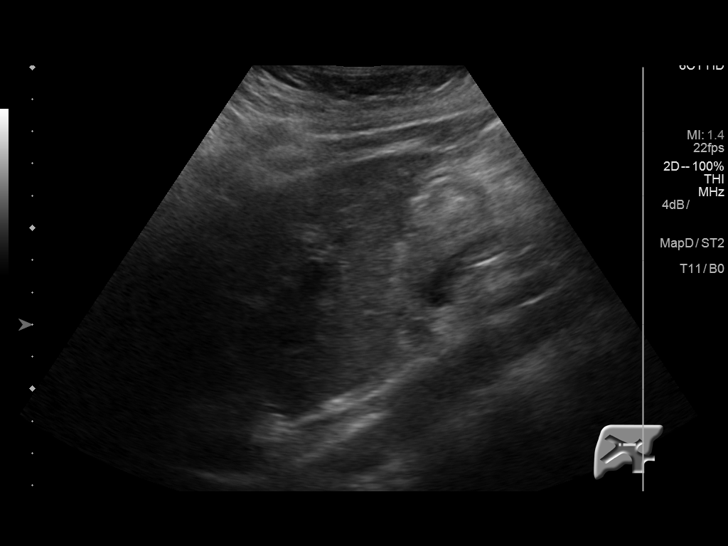
[im 40/54]
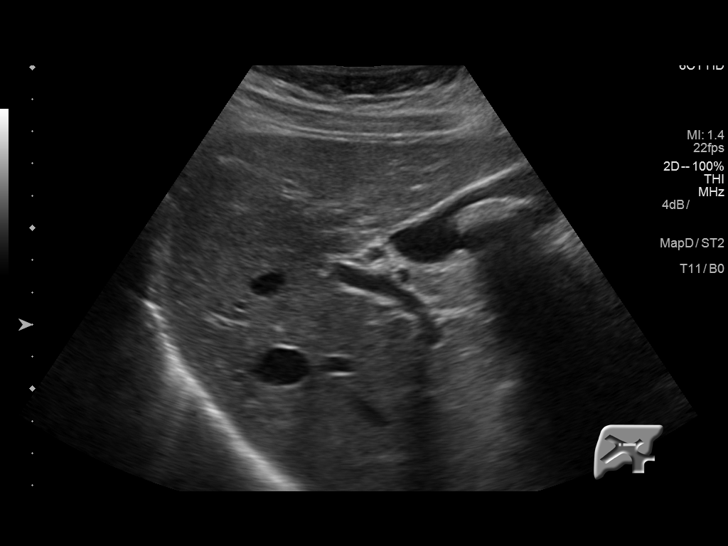
[im 45/54]
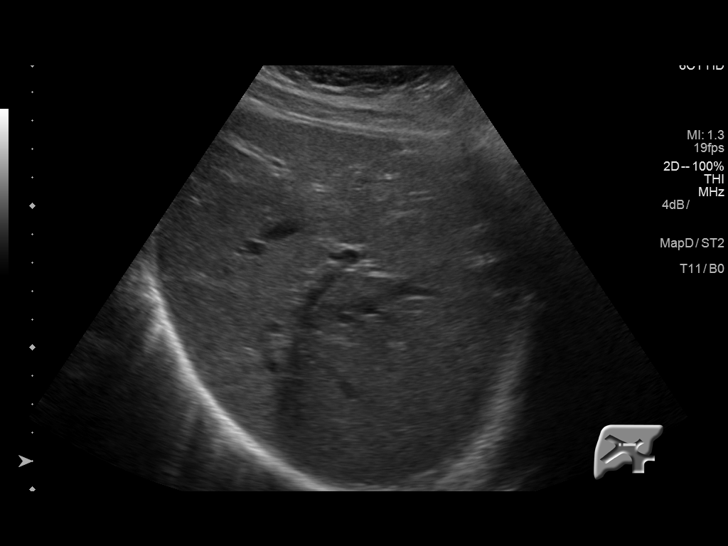
[im 49/54]
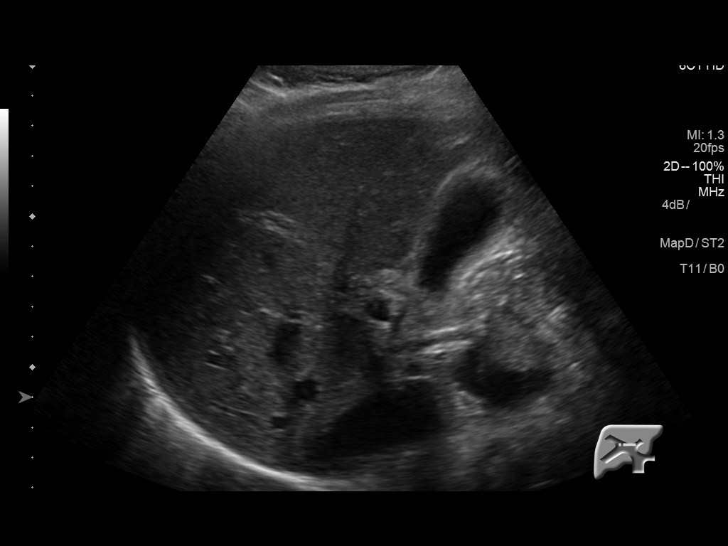
[im 54/54]
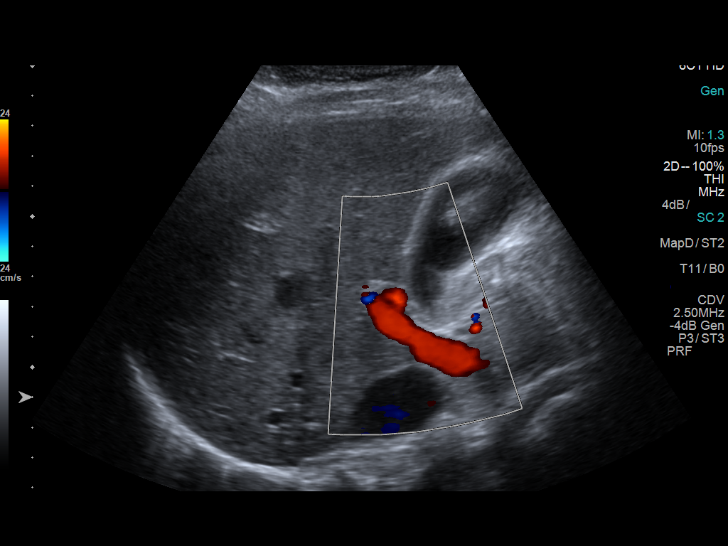

[14 of 25 positions shown; findings below may reference images not displayed]

FINDINGS: Gallbladder:

Single large calculus measuring 3.1 cm is noted. No gallbladder wall
thickening or pericholecystic fluid is noted. No sonographic
Murphy's sign is noted.

Common bile duct:

Diameter: 3.1 mm which is within normal limits.

Liver:

No focal lesion identified. Within normal limits in parenchymal
echogenicity.
IMPRESSION: Large solitary gallstone is noted without definite evidence of
cholecystitis. If there is clinical concern for cholecystitis, HIDA
scan may be performed further evaluation.
